# Patient Record
Sex: Female | Born: 1995 | Hispanic: Yes | Marital: Single | Smoking: Never smoker
Health system: Southern US, Community
[De-identification: ages and names within clinical notes are randomized; demographics above are authoritative.]

## PROBLEM LIST (undated history)

## (undated) ENCOUNTER — Encounter

## (undated) ENCOUNTER — Telehealth

## (undated) ENCOUNTER — Encounter: Attending: Internal Medicine | Primary: Internal Medicine

## (undated) ENCOUNTER — Encounter
Attending: Pharmacist Clinician (PhC)/ Clinical Pharmacy Specialist | Primary: Pharmacist Clinician (PhC)/ Clinical Pharmacy Specialist

## (undated) ENCOUNTER — Ambulatory Visit: Payer: Medicaid (Managed Care)

## (undated) ENCOUNTER — Telehealth: Attending: Clinical | Primary: Clinical

## (undated) ENCOUNTER — Encounter: Payer: PRIVATE HEALTH INSURANCE | Attending: Family | Primary: Family

## (undated) ENCOUNTER — Telehealth: Attending: Obstetrics & Gynecology | Primary: Obstetrics & Gynecology

## (undated) ENCOUNTER — Ambulatory Visit: Payer: PRIVATE HEALTH INSURANCE

## (undated) ENCOUNTER — Encounter
Payer: PRIVATE HEALTH INSURANCE | Attending: Student in an Organized Health Care Education/Training Program | Primary: Student in an Organized Health Care Education/Training Program

## (undated) ENCOUNTER — Encounter: Attending: Infectious Disease | Primary: Infectious Disease

## (undated) ENCOUNTER — Telehealth: Attending: Internal Medicine | Primary: Internal Medicine

## (undated) ENCOUNTER — Ambulatory Visit: Payer: Medicaid (Managed Care) | Attending: Obstetrics & Gynecology | Primary: Obstetrics & Gynecology

## (undated) ENCOUNTER — Encounter: Attending: Obstetrics & Gynecology | Primary: Obstetrics & Gynecology

## (undated) ENCOUNTER — Ambulatory Visit

## (undated) ENCOUNTER — Encounter: Payer: PRIVATE HEALTH INSURANCE | Attending: Clinical | Primary: Clinical

## (undated) ENCOUNTER — Ambulatory Visit: Attending: Obstetrics | Primary: Obstetrics

## (undated) DIAGNOSIS — R569 Unspecified convulsions: Secondary | ICD-10-CM

## (undated) DIAGNOSIS — G47419 Narcolepsy without cataplexy: Secondary | ICD-10-CM

---
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
## 2017-04-20 IMAGING — CT CT ANGIO CHEST
2 of 9 series · 19 of 46 positions shown · IV contrast (isovue)
Comparison: Chest radiograph yesterday.

CLINICAL DATA: Chest tightness and shortness of breath.

EXAM:
CT ANGIOGRAPHY CHEST WITH CONTRAST
TECHNIQUE: Multidetector CT imaging of the chest was performed using the
standard protocol during bolus administration of intravenous
contrast. Multiplanar CT image reconstructions and MIPs were
obtained to evaluate the vascular anatomy.
CONTRAST:  100 cc Isovue 370 IV

[Series 5: thins · axial · 0.59mm/px · z∈[+437,+708]mm · 16 of 305 slices shown]
[im 17/305  lung]
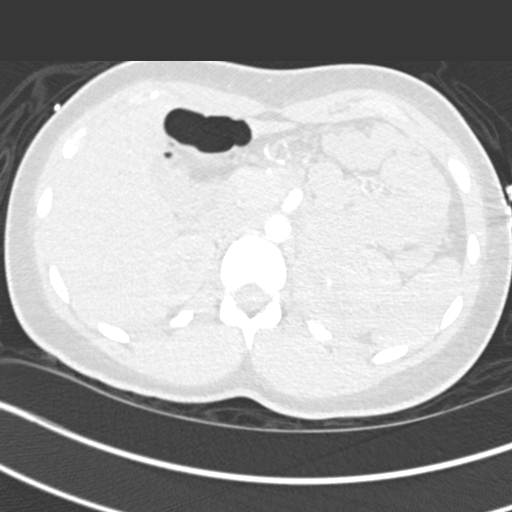
[im 34/305  soft-tissue]
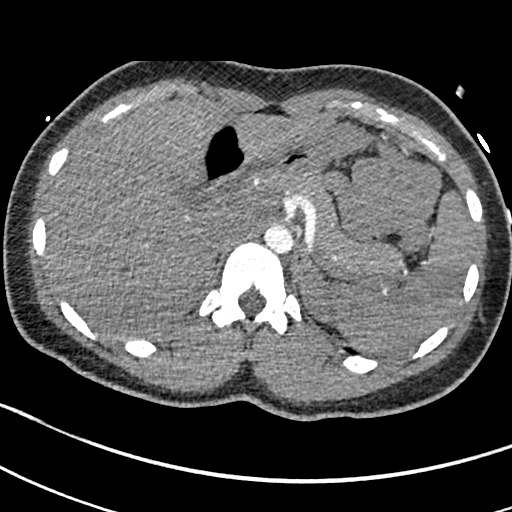
[im 51/305  lung]
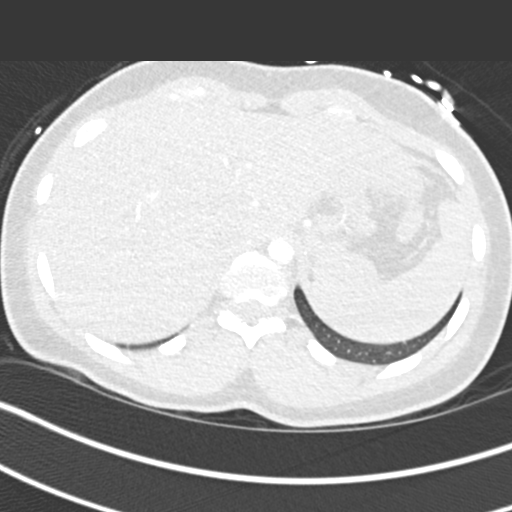
[im 68/305  soft-tissue]
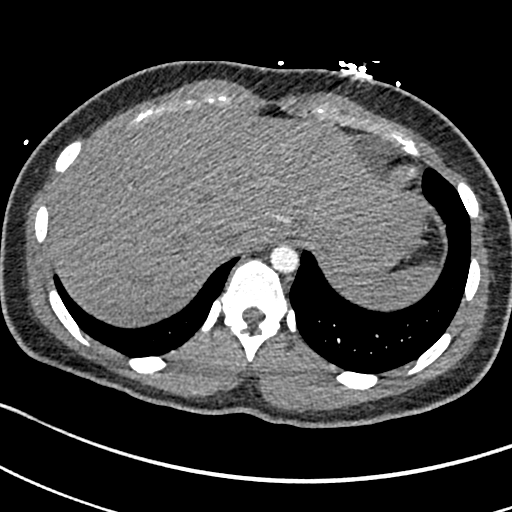
[im 85/305  lung]
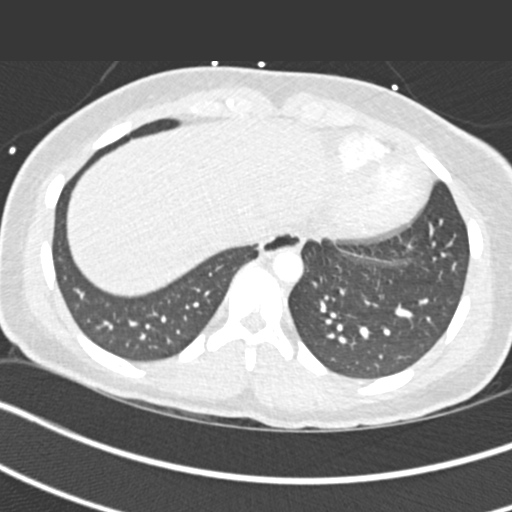
[im 102/305  soft-tissue]
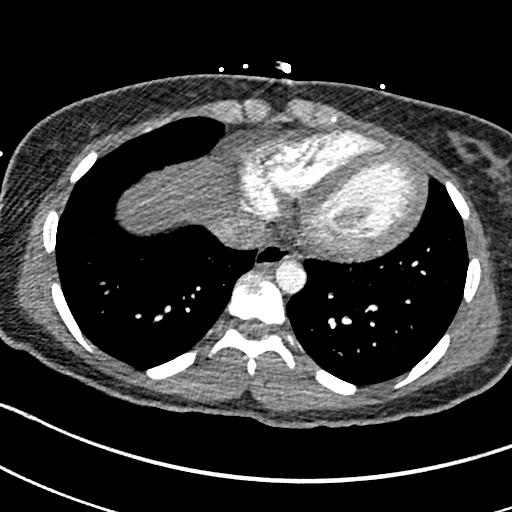
[im 119/305  lung]
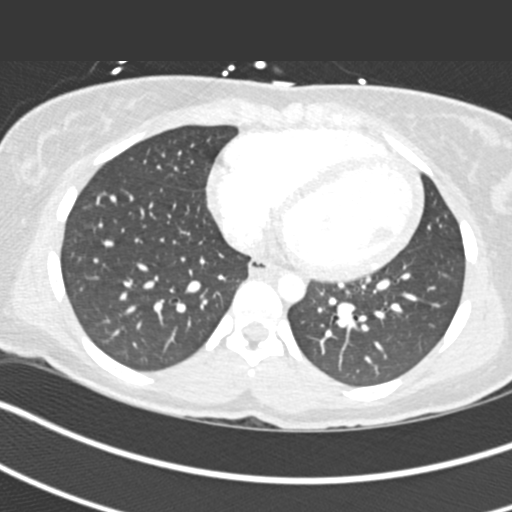
[im 136/305  soft-tissue]
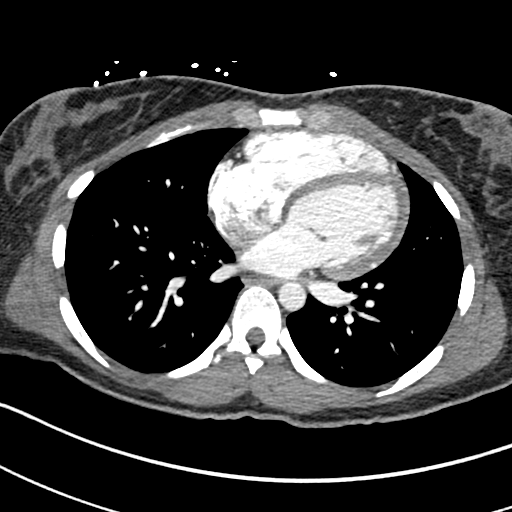
[im 169/305  lung]
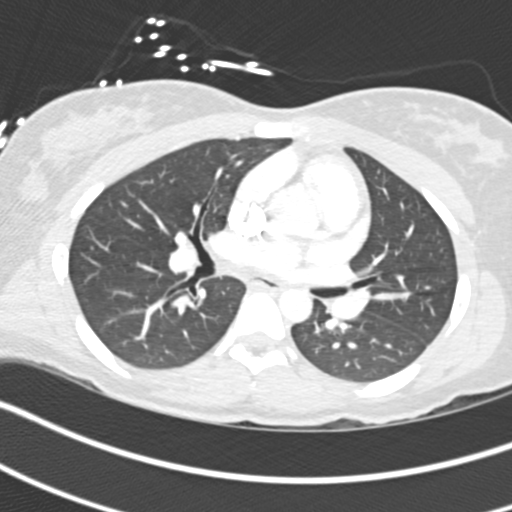
[im 186/305  soft-tissue]
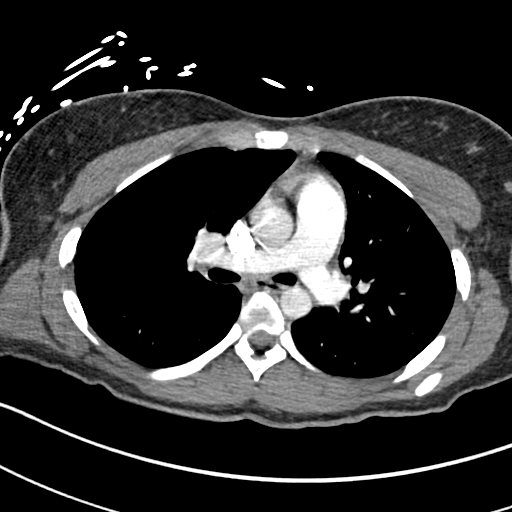
[im 203/305  lung]
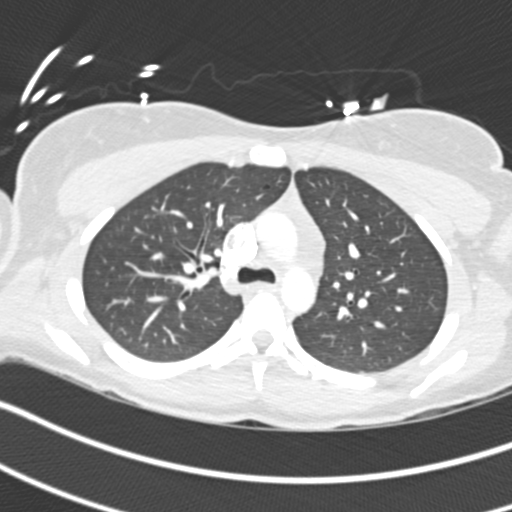
[im 220/305  soft-tissue]
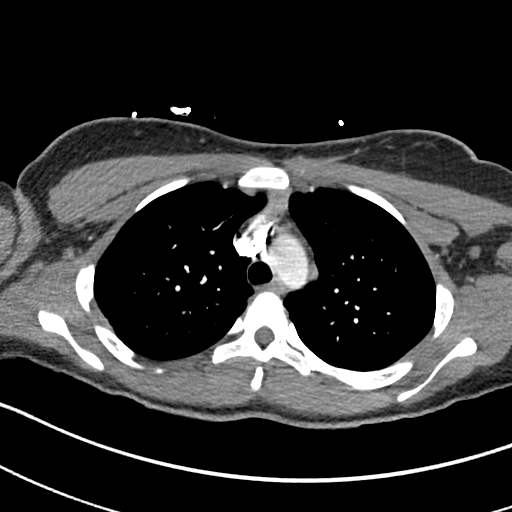
[im 237/305  lung]
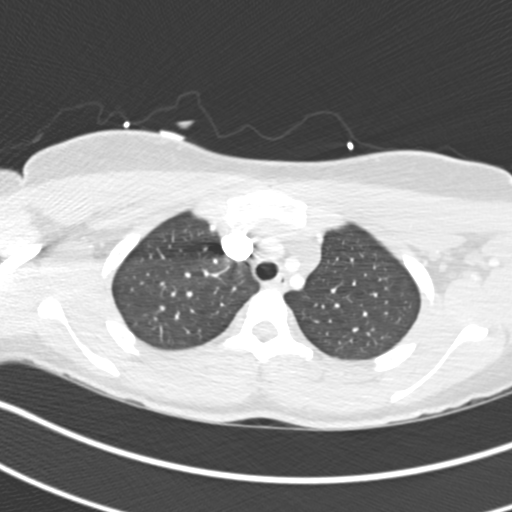
[im 254/305  soft-tissue]
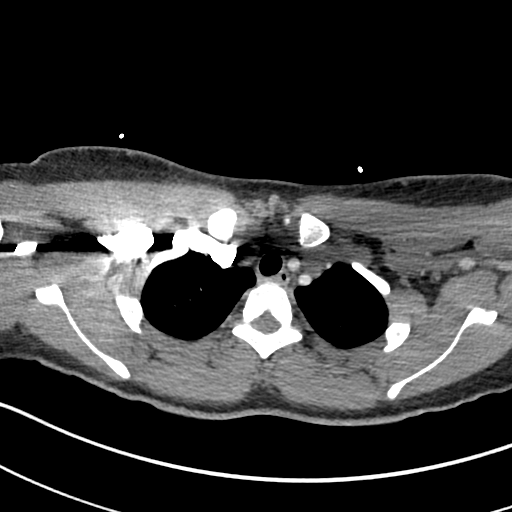
[im 271/305  lung]
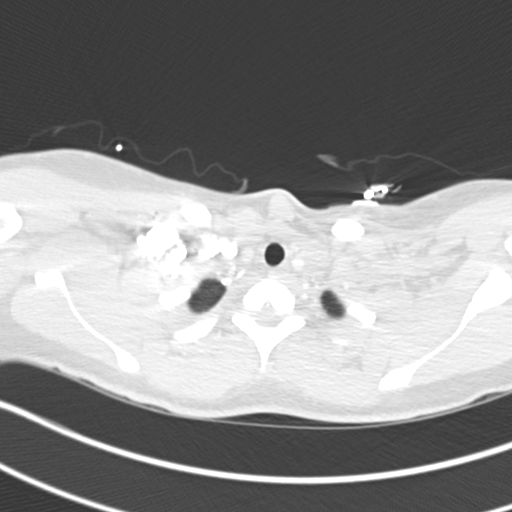
[im 288/305  soft-tissue]
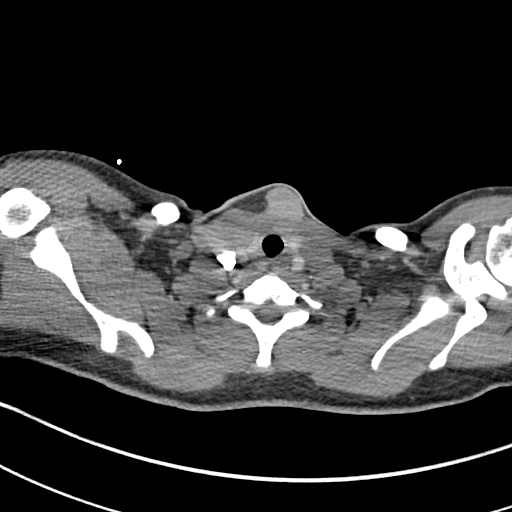

[Series 10: coronal mpr · coronal · 0.59mm/px · 3 of 151 slices shown]
[im 31/151  soft-tissue]
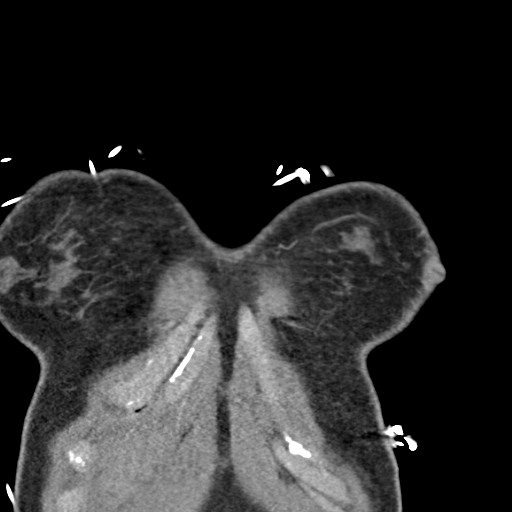
[im 61/151  soft-tissue]
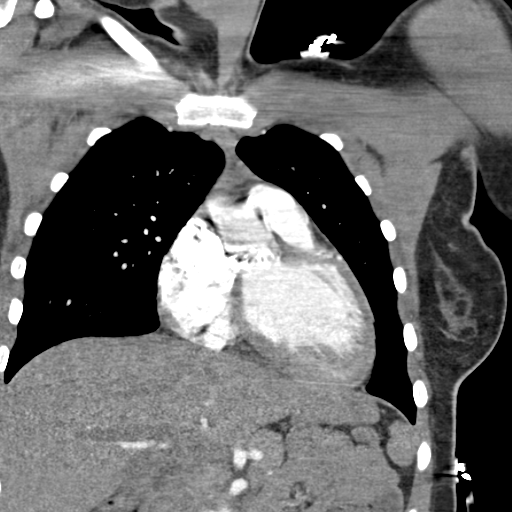
[im 91/151  soft-tissue]
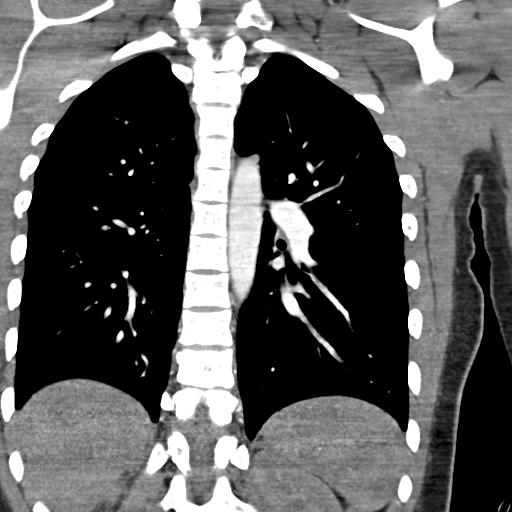

[19 of 46 positions shown; findings below may reference images not displayed]

FINDINGS: Cardiovascular: Satisfactory opacification of the pulmonary arteries
to the segmental level. No evidence of pulmonary embolism. Normal
heart size. No pericardial effusion.

Mediastinum/Nodes: No enlarged mediastinal, hilar, or axillary lymph
nodes. Normal for age minimal residual thymus. Thyroid gland,
trachea, and esophagus demonstrate no significant findings.

Lungs/Pleura: Clear lungs. No consolidation, pulmonary edema or
pleural fluid.

Upper Abdomen: No acute abnormality.

Musculoskeletal: No chest wall abnormality. No acute or significant
osseous findings.

Review of the MIP images confirms the above findings.
IMPRESSION: Normal CTA of the chest.  No specifically, no pulmonary embolus.

## 2018-02-11 ENCOUNTER — Ambulatory Visit (INDEPENDENT_AMBULATORY_CARE_PROVIDER_SITE_OTHER): Payer: Federal, State, Local not specified - PPO

## 2018-02-11 ENCOUNTER — Ambulatory Visit (HOSPITAL_COMMUNITY)
Admission: EM | Admit: 2018-02-11 | Discharge: 2018-02-11 | Disposition: A | Discharge status: Home / Self Care | Payer: Federal, State, Local not specified - PPO | Attending: Internal Medicine | Admitting: Internal Medicine

## 2018-02-11 ENCOUNTER — Other Ambulatory Visit: Payer: Self-pay

## 2018-02-11 ENCOUNTER — Encounter (HOSPITAL_COMMUNITY): Payer: Self-pay | Admitting: Emergency Medicine

## 2018-02-11 DIAGNOSIS — M79672 Pain in left foot: Secondary | ICD-10-CM

## 2018-02-11 DIAGNOSIS — H1032 Unspecified acute conjunctivitis, left eye: Secondary | ICD-10-CM | POA: Diagnosis not present

## 2018-02-11 HISTORY — DX: Narcolepsy without cataplexy: G47.419

## 2018-02-11 MED ORDER — TETRACAINE HCL 0.5 % OP SOLN
OPHTHALMIC | Status: AC
  Filled 2018-02-11: qty 4

## 2018-02-11 MED ORDER — ERYTHROMYCIN 5 MG/GM OP OINT: TOPICAL_OINTMENT | OPHTHALMIC | 0 refills | Status: AC

## 2018-02-11 NOTE — ED Triage Notes (Signed)
Left foot issue for 2 weeks.  Patient has pain in the middle of the sole of foot.  No known injury  Left eye issues that started last night.  Reports having felt like something in left eye.  Left eye swollen. Patient reports dried drainage around eyelashes this morning.  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

## 2018-02-11 NOTE — Discharge Instructions (Addendum)
You may not work with clients until you have used the antibiotic eye medication for 24 hours and there is no more eye discharge. You are contagious via touch, so wash your hands any time you touch your eyes.   Take Ibuprofe up to 800 mg three times a day for 7 days for pain and inflammation of your foot and do some foot stretches.   Follow with foot specialist if you dont need a referral from your family Dr.    Claudie Fisherman back if your eye gets worse this weekend.

## 2018-02-11 NOTE — ED Provider Notes (Signed)
[facility]    CSN: [id number] Arrival date & time: 02/11/18  1122     History   Chief Complaint Chief Complaint  Patient presents with  . Eye Problem  . Foot Pain    HPI Amanda Matthews is a 23 y.o. female.   1- Has L mid sole of foot pain x 2 weeks. Denies an injury.  This started while laying in bed and felt itchy and swollen. Pain is intermittent and is provoked with walking. Has massaged it, and has not tried anything else. Nothing has helped the pain.   2- L eye felt like she had something last night and this am woke up with crustiness. This am felt the FB sensation in her L eye again. 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 She does not wear contacts. Has not had URI.      Past Medical History:  Diagnosis Date  . Narcolepsy   . Seizures (HCC)     There are no active problems to display for this patient.   History reviewed. No pertinent surgical history.  OB History   No obstetric history on file.      Home Medications    Prior to Admission medications   Medication Sig Start Date End Date Taking? Authorizing Provider  Armodafinil 200 MG TABS Take 300 mg by mouth daily.  09/28/15  Yes [provider]  bismuth subsalicylate (PEPTO BISMOL) 262 MG/15ML suspension Take 30 mLs by mouth every 6 (six) hours as needed for indigestion.     [provider]  erythromycin ophthalmic ointment Place a 1/2 inch ribbon of ointment into the L  lower eyelid tid x 7 days 02/11/18   Rodriguez-Southworth, Nettie ElmSylvia, PA-C    Family History Family History  Problem Relation Age of Onset  . Hypertension Father     Social History Social History   Tobacco Use  . Smoking status: Never Smoker  . Smokeless tobacco: Never Used  Substance Use Topics  . Alcohol use: No  . Drug use: No     Allergies   Patient has no known allergies.   Review of Systems Review of Systems  Constitutional: Negative for chills, diaphoresis and fever.    HENT: Negative.   Eyes: Positive for pain, discharge and itching. Negative for photophobia, redness and visual disturbance.       Has mild pain when she closes her eyes.   Respiratory: Negative for cough.   Cardiovascular: Negative for leg swelling.  Musculoskeletal: Positive for arthralgias and gait problem.       L foot pain as noted in HPI  Skin: Negative for color change, rash and wound.  Neurological: Negative for weakness and numbness.  Hematological: Negative for adenopathy.     Physical Exam Triage Vital Signs ED Triage Vitals  Enc Vitals Group     BP 02/11/18 1310 (!) 119/43     Pulse Rate 02/11/18 1310 63     Resp 02/11/18 1310 18     Temp 02/11/18 1310 98 F (36.7 C)     Temp Source 02/11/18 1310 Oral     SpO2 02/11/18 1310 100 %     Weight --      Height --      Head Circumference --      Peak Flow --      Pain Score 02/11/18 1305 7     Pain Loc --      Pain Edu? --      Excl. in  GC? --    No data found.  Updated Vital Signs BP (!) 119/43 (BP Location: Right Arm)   Pulse 63   Temp 98 F (36.7 C) (Oral)   Resp 18   LMP 01/25/2018   SpO2 100%   Visual Acuity Right Eye Distance:   Left Eye Distance:   Bilateral Distance:    Right Eye Near:   Left Eye Near:    Bilateral Near:     Physical Exam Vitals signs and nursing note reviewed.  Constitutional:      General: She is not in acute distress.    Appearance: Normal appearance. She is not toxic-appearing.  HENT:     Right Ear: Tympanic membrane, ear canal and external ear normal.     Left Ear: Tympanic membrane, ear canal and external ear normal.     Nose: Nose normal.  Eyes:     General: No scleral icterus.       Left eye: Discharge present.    Extraocular Movements: Extraocular movements intact.     Pupils: Pupils are equal, round, and reactive to light.     Comments: L conjunctiva is injected. No FB or corneal abrasion noted with fluorecine stain.   Neck:     Musculoskeletal: Neck  supple.  Pulmonary:     Effort: Pulmonary effort is normal.  Musculoskeletal:        General: Tenderness present.     Comments: L mid dorsal foot looks a little swollen and is very tender to palpation, I made her cry.   Skin:    General: Skin is warm and dry.     Findings: No bruising, erythema or rash.  Neurological:     Mental Status: She is alert and oriented to person, place, and time.     Gait: Gait normal.  Psychiatric:        Mood and Affect: Mood normal.        Behavior: Behavior normal.        Thought Content: Thought content normal.        Judgment: Judgment normal.      UC Treatments / Results  Labs (all labs ordered are listed, but only abnormal results are displayed) Labs Reviewed - No data to display  EKG None  Radiology Dg Foot Complete Left  Result Date: 02/11/2018 CLINICAL DATA:  Midfoot pain for 2 weeks, no known injury, initial encounter EXAM: LEFT FOOT - COMPLETE 3+ VIEW COMPARISON:  None. FINDINGS: There is no evidence of fracture or dislocation. There is no evidence of arthropathy or other focal bone abnormality. Soft tissues are unremarkable. IMPRESSION: No acute abnormality noted. Electronically Signed   By: Alcide Clever M.D.   On: 02/11/2018 14:14    Procedures Procedures   Medications Ordered in UC Medications - No data to display  Initial Impression / Assessment and Plan / UC Course  I have reviewed the triage vital signs and the nursing notes.  Pertinent labs & imaging results that were available during my care of the patient were reviewed by me and considered in my medical decision making (see chart for details).    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 You are contagious via touch, so  wash  your hands any time you touch your eyes.   Take Ibuprofe up to 800 mg three times a day for 7 days for pain and inflammation of your foot and do some foot stretches.   Follow with foot specialist if you dont need a referral from your family Dr.    Claudie Fishermanome back if your eye gets worse this weekend.     ED Prescriptions    Medication Sig Dispense Auth. Provider   erythromycin ophthalmic ointment Place a 1/2 inch ribbon of ointment into the L  lower eyelid tid x 7 days 3.5 g Rodriguez-Southworth, Nettie ElmSylvia, PA-C     Controlled Substance Prescriptions [place] Controlled Substance Registry consulted?    Garey HamRodriguez-Southworth, Jenelle Drennon, [place]-C 02/12/18 1842

## 2018-03-31 ENCOUNTER — Ambulatory Visit (HOSPITAL_COMMUNITY)
Admission: EM | Admit: 2018-03-31 | Discharge: 2018-03-31 | Disposition: A | Discharge status: Home / Self Care | Payer: Federal, State, Local not specified - PPO | Attending: Family Medicine | Admitting: Family Medicine

## 2018-03-31 ENCOUNTER — Encounter (HOSPITAL_COMMUNITY): Payer: Self-pay | Admitting: Emergency Medicine

## 2018-03-31 ENCOUNTER — Other Ambulatory Visit: Payer: Self-pay

## 2018-03-31 DIAGNOSIS — R000 Tachycardia, unspecified: Secondary | ICD-10-CM | POA: Diagnosis not present

## 2018-03-31 DIAGNOSIS — J020 Streptococcal pharyngitis: Secondary | ICD-10-CM | POA: Diagnosis not present

## 2018-03-31 LAB — POCT RAPID STREP A: Streptococcus, Group A Screen (Direct): POSITIVE — AB

## 2018-03-31 MED ORDER — ONDANSETRON 4 MG PO TBDP
ORAL_TABLET | ORAL | Status: AC
  Filled 2018-03-31: qty 1

## 2018-03-31 MED ORDER — PENICILLIN G BENZATHINE 1200000 UNIT/2ML IM SUSP
1.2000 10*6.[IU] | Freq: Once | INTRAMUSCULAR | Status: AC
  Administered 2018-03-31: 1.2 10*6.[IU] via INTRAMUSCULAR

## 2018-03-31 MED ORDER — ACETAMINOPHEN 325 MG PO TABS
975.0000 mg | ORAL_TABLET | Freq: Once | ORAL | Status: AC
  Administered 2018-03-31: 975 mg via ORAL

## 2018-03-31 MED ORDER — ONDANSETRON 4 MG PO TBDP
4.0000 mg | ORAL_TABLET | Freq: Once | ORAL | Status: AC
  Administered 2018-03-31: 4 mg via ORAL

## 2018-03-31 MED ORDER — ONDANSETRON 4 MG PO TBDP: 4.0000 mg | ORAL_TABLET | Freq: Three times a day (TID) | ORAL | 0 refills | Status: AC | PRN

## 2018-03-31 MED ORDER — ACETAMINOPHEN 325 MG PO TABS
ORAL_TABLET | ORAL | Status: AC
  Filled 2018-03-31: qty 3

## 2018-03-31 MED ORDER — PENICILLIN G BENZATHINE 1200000 UNIT/2ML IM SUSP
INTRAMUSCULAR | Status: AC
  Filled 2018-03-31: qty 2

## 2018-03-31 NOTE — ED Provider Notes (Signed)
[facility]    CSN: [id number] Arrival date & time: 03/31/18  1829     History   Chief Complaint Chief Complaint  Patient presents with  . Fatigue    HPI Amanda Matthews is a 23 y.o. female.   Amanda Matthews presents with complaints of nausea, vomiting, fatigue, weakness, sore throat which started two days ago. States sore throat worsened due to vomiting. Hx of mouth ulcers and had drank acidic fruit juices while in [place] March 1-6. No ear pain. No cough or congsetion. Drinking some fluids. Has vomited up to 5x a day. Limited solid intake. Decreased appetite. No known ill contacts. Headache. Hx of narcolepsy. Hasn't taken any medications except for her medicine to help her sleep at night. Low grade fevers. No rash.    ROS per HPI, negative if not otherwise mentioned.      Past Medical History:  Diagnosis Date  . Narcolepsy   . Seizures (HCC)     There are no active problems to display for this patient.   History reviewed. No pertinent surgical history.  OB History   No obstetric history on file.      Home Medications    Prior to Admission medications   Medication Sig Start Date End Date Taking? Authorizing Provider  Armodafinil 200 MG TABS Take 300 mg by mouth daily.  09/28/15   [provider]  bismuth subsalicylate (PEPTO BISMOL) 262 MG/15ML suspension Take 30 mLs by mouth every 6 (six) hours as needed for indigestion.     [provider]  erythromycin ophthalmic ointment Place a 1/2 inch ribbon of ointment into the L  lower eyelid tid x 7 days 02/11/18   Rodriguez-Southworth, Nettie Elm, PA-C  ondansetron (ZOFRAN-ODT) 4 MG disintegrating tablet Take 1 tablet (4 mg total) by mouth every 8 (eight) hours as needed for nausea or vomiting. 03/31/18   Georgetta Haber, NP    Family History Family History  Problem Relation Age of Onset  . Hypertension Father     Social History Social History   Tobacco Use  . Smoking status:  Never Smoker  . Smokeless tobacco: Never Used  Substance Use Topics  . Alcohol use: No  . Drug use: No     Allergies   Patient has no known allergies.   Review of Systems Review of Systems   Physical Exam Triage Vital Signs ED Triage Vitals [03/31/18 1919]  Enc Vitals Group     BP 96/62     Pulse Rate (!) 148     Resp (!) 22     Temp (!) 100.9 F (38.3 C)     Temp Source Temporal     SpO2 100 %     Weight 185 lb (83.9 kg)     Height 5\' 2"  (1.575 m)     Head Circumference      Peak Flow      Pain Score 9     Pain Loc      Pain Edu?      Excl. in GC?    No data found.  Updated Vital Signs BP 96/62 (BP Location: Right Arm)   Pulse (!) 148   Temp (!) 100.9 F (38.3 C) (Temporal)   Resp (!) 22   Ht 5\' 2"  (1.575 m)   Wt 185 lb (83.9 kg)   LMP 03/15/2018 (Exact Date)   SpO2 100%   BMI 33.84 kg/m    Physical Exam Constitutional:  General: She is not in acute distress.    Appearance: She is well-developed. She is ill-appearing.  HENT:     Head: Normocephalic and atraumatic.     Right Ear: Tympanic membrane, ear canal and external ear normal.     Left Ear: Tympanic membrane, ear canal and external ear normal.     Nose: Nose normal.     Mouth/Throat:     Pharynx: Uvula midline. Pharyngeal swelling and posterior oropharyngeal erythema present. No uvula swelling.     Tonsils: Tonsillar exudate present. Swelling: 2+ on the right.     Comments: Dark and white exudate to right tonsil- exudate? Vomiting on this provider's arrival to room Eyes:     Conjunctiva/sclera: Conjunctivae normal.     Pupils: Pupils are equal, round, and reactive to light.  Cardiovascular:     Rate and Rhythm: Regular rhythm. Tachycardia present.     Heart sounds: Normal heart sounds.  Pulmonary:     Effort: Pulmonary effort is normal.     Breath sounds: Normal breath sounds.  Skin:    General: Skin is warm and dry.  Neurological:     Mental Status: She is alert and oriented to  person, place, and time.      UC Treatments / Results  Labs (all labs ordered are listed, but only abnormal results are displayed) Labs Reviewed  POCT RAPID STREP A - Abnormal; Notable for the following components:      Result Value   Streptococcus, Group A Screen (Direct) POSITIVE (*)    All other components within normal limits    EKG None  Radiology No results found.  Procedures Procedures (including critical care time)  Medications Ordered in UC Medications  ondansetron (ZOFRAN-ODT) disintegrating tablet 4 mg (4 mg Oral Given 03/31/18 1946)  acetaminophen (TYLENOL) tablet 975 mg (975 mg Oral Given 03/31/18 1955)  penicillin g benzathine (BICILLIN LA) 1200000 UNIT/2ML injection 1.2 Million Units (1.2 Million Units Intramuscular Given 03/31/18 1954)    Initial Impression / Assessment and Plan / UC Course  I have reviewed the triage vital signs and the nursing notes.  Pertinent labs & imaging results that were available during my care of the patient were reviewed by me and considered in my medical decision making (see chart for details).     zofran and bicillin provided in room with mild improvement in nausea. Return precautions provided. If symptoms worsen or do not improve in the next week to return to be seen or to follow up with PCP.  Patient verbalized understanding and agreeable to plan.   Final Clinical Impressions(s) / UC Diagnoses   Final diagnoses:  Strep pharyngitis     Discharge Instructions     Throat lozenges, gargles, chloraseptic spray, warm teas, popsicles etc to help with throat pain.   You should start feeling much better within the next 24-48 hours.  Zofran every 8 hours as needed for nausea or vomiting.   Tylenol and/or ibuprofen as needed for pain or fevers.   If symptoms worsen or do not improve in the next week to return to be seen or to follow up with your PCP.     ED Prescriptions    Medication Sig Dispense Auth. Provider   ondansetron  (ZOFRAN-ODT) 4 MG disintegrating tablet Take 1 tablet (4 mg total) by mouth every 8 (eight) hours as needed for nausea or vomiting. 12 tablet Georgetta HaberBurky, Miriya Cloer B, NP     Controlled Substance Prescriptions [place] Controlled Substance Registry consulted? Not Applicable  Georgetta Haber, NP 03/31/18 2019

## 2018-03-31 NOTE — Discharge Instructions (Signed)
Throat lozenges, gargles, chloraseptic spray, warm teas, popsicles etc to help with throat pain.   You should start feeling much better within the next 24-48 hours.  Zofran every 8 hours as needed for nausea or vomiting.   Tylenol and/or ibuprofen as needed for pain or fevers.   If symptoms worsen or do not improve in the next week to return to be seen or to follow up with your PCP.

## 2018-03-31 NOTE — ED Triage Notes (Signed)
Per pt she started having weakness, fatigue and nausea with a headache and generalized body ache since Wednesday.. throat is now sore due to vomiting. No appetite. Fever at this time.

## 2018-03-31 NOTE — ED Notes (Addendum)
Pt given a full glass of water to drink to encourage fluid intake.  Pt is not ready to take the tylenol yet.  She is resting in the room until the nausea subsides more.

## 2018-03-31 NOTE — ED Notes (Signed)
Pt has taken her Tylenol but is concerned may vomit again.  D/C paperwork has been given but pt is not ready to leave yet.  She is resting on the table.

## 2018-04-02 ENCOUNTER — Encounter
Admit: 2018-04-02 | Discharge: 2018-04-02 | Disposition: A | Discharge status: Home / Self Care | Payer: PRIVATE HEALTH INSURANCE

## 2018-04-02 DIAGNOSIS — N390 Urinary tract infection, site not specified: Principal | ICD-10-CM

## 2018-04-02 DIAGNOSIS — R1110 Vomiting, unspecified: Principal | ICD-10-CM

## 2018-04-02 DIAGNOSIS — J029 Acute pharyngitis, unspecified: Principal | ICD-10-CM

## 2018-04-02 DIAGNOSIS — R1115 Cyclical vomiting syndrome unrelated to migraine: Principal | ICD-10-CM

## 2018-04-09 ENCOUNTER — Encounter
Admit: 2018-04-09 | Discharge: 2018-04-09 | Disposition: A | Discharge status: Home / Self Care | Payer: PRIVATE HEALTH INSURANCE

## 2018-04-09 DIAGNOSIS — G47419 Narcolepsy without cataplexy: Principal | ICD-10-CM

## 2018-04-09 DIAGNOSIS — N390 Urinary tract infection, site not specified: Principal | ICD-10-CM

## 2018-04-09 DIAGNOSIS — Z87440 Personal history of urinary (tract) infections: Principal | ICD-10-CM

## 2018-04-09 DIAGNOSIS — T3695XA Adverse effect of unspecified systemic antibiotic, initial encounter: Principal | ICD-10-CM

## 2018-04-09 DIAGNOSIS — R1110 Vomiting, unspecified: Principal | ICD-10-CM

## 2018-04-09 DIAGNOSIS — R197 Diarrhea, unspecified: Principal | ICD-10-CM

## 2018-04-09 DIAGNOSIS — R112 Nausea with vomiting, unspecified: Principal | ICD-10-CM

## 2019-07-03 ENCOUNTER — Other Ambulatory Visit: Payer: Self-pay

## 2019-07-03 ENCOUNTER — Encounter (HOSPITAL_COMMUNITY): Payer: Self-pay

## 2019-07-03 ENCOUNTER — Ambulatory Visit (HOSPITAL_COMMUNITY)
Admission: EM | Admit: 2019-07-03 | Discharge: 2019-07-03 | Disposition: A | Discharge status: Home / Self Care | Payer: Federal, State, Local not specified - PPO | Attending: Physician Assistant | Admitting: Physician Assistant

## 2019-07-03 DIAGNOSIS — R05 Cough: Secondary | ICD-10-CM | POA: Insufficient documentation

## 2019-07-03 DIAGNOSIS — Z20822 Contact with and (suspected) exposure to covid-19: Secondary | ICD-10-CM | POA: Diagnosis not present

## 2019-07-03 DIAGNOSIS — Z8249 Family history of ischemic heart disease and other diseases of the circulatory system: Secondary | ICD-10-CM | POA: Insufficient documentation

## 2019-07-03 DIAGNOSIS — Z79899 Other long term (current) drug therapy: Secondary | ICD-10-CM | POA: Insufficient documentation

## 2019-07-03 DIAGNOSIS — R059 Cough, unspecified: Secondary | ICD-10-CM

## 2019-07-03 DIAGNOSIS — J0110 Acute frontal sinusitis, unspecified: Secondary | ICD-10-CM | POA: Insufficient documentation

## 2019-07-03 DIAGNOSIS — R0982 Postnasal drip: Secondary | ICD-10-CM

## 2019-07-03 MED ORDER — BENZONATATE 100 MG PO CAPS: 100.0000 mg | ORAL_CAPSULE | Freq: Three times a day (TID) | ORAL | 0 refills | Status: AC

## 2019-07-03 MED ORDER — AMOXICILLIN-POT CLAVULANATE 875-125 MG PO TABS: 1.0000 | ORAL_TABLET | Freq: Two times a day (BID) | ORAL | 0 refills | Status: AC

## 2019-07-03 MED ORDER — SALINE SPRAY 0.65 % NA SOLN: 1.0000 | NASAL | 0 refills | Status: AC | PRN

## 2019-07-03 MED ORDER — FLUTICASONE PROPIONATE 50 MCG/ACT NA SUSP: 1.0000 | Freq: Every day | NASAL | 0 refills | Status: AC

## 2019-07-03 NOTE — ED Provider Notes (Signed)
[facility]    CSN: [id number] Arrival date & time: 07/03/19  1229      History   Chief Complaint Chief Complaint  Patient presents with  . cough, nasal congestion, fatigue    HPI Amanda Matthews is a 24 y.o. female.   Patient reports for 2-week history of dry cough, nasal congestion and runny nose and fatigue.  She reports symptoms have been persistent and slightly worsening over the last 2 weeks.  She reports cough has remained dry.  The cough is bothersome occurring throughout the day and occasionally at night.  Nasal congestion has been persistent and slightly increasing over the last 2 weeks.  She reports mainly clear nasal discharge early however there is been some yellow discharge lately she has tried over-the-counter medicines for these things and these did not help.  She reports constant fatigue despite good sleep.  She has had occasional nausea but no vomiting.  Occasional loose stool but is also having formed stools.  She has not had any shortness of breath or chest pain.  Denies significant sore throat.  Occasional frontal headache and pressure behind her eyes.  She reports she had a Covid test last week at her [facility] the rapid was negative she did not hear back on the send out results.         Past Medical History:  Diagnosis Date  . Narcolepsy   . Seizures ([place])     There are no problems to display for this patient.   History reviewed. No pertinent surgical history.  OB History   No obstetric history on file.      Home Medications    Prior to Admission medications   Medication Sig Start Date End Date Taking? Authorizing Provider  amoxicillin-clavulanate (AUGMENTIN) 875-125 MG tablet Take 1 tablet by mouth 2 (two) times daily for 7 days. 07/03/19 07/10/19  Shoichi Mielke, Marguerita Beards, PA-C  Armodafinil 200 MG TABS Take 300 mg by mouth daily.  09/28/15   [provider]  benzonatate (TESSALON) 100 MG capsule Take 1 capsule (100 mg total) by  mouth every 8 (eight) hours. 07/03/19   Manjit Bufano, Marguerita Beards, PA-C  bismuth subsalicylate (PEPTO BISMOL) 262 MG/15ML suspension Take 30 mLs by mouth every 6 (six) hours as needed for indigestion.     [provider]  erythromycin ophthalmic ointment Place a 1/2 inch ribbon of ointment into the L  lower eyelid tid x 7 days 02/11/18   Rodriguez-Southworth, Sunday Spillers, PA-C  fluticasone (FLONASE) 50 MCG/ACT nasal spray Place 1 spray into both nostrils daily. 07/03/19   Trace Cederberg, Marguerita Beards, PA-C  Methylphenidate HCl ER 72 MG TBCR Take 1 tablet by mouth every morning. 01/09/19   [provider]  ondansetron (ZOFRAN-ODT) 4 MG disintegrating tablet Take 1 tablet (4 mg total) by mouth every 8 (eight) hours as needed for nausea or vomiting. 03/31/18   Zigmund Gottron, NP  sodium chloride (OCEAN) 0.65 % SOLN nasal spray Place 1 spray into both nostrils as needed for congestion. 07/03/19   Aliveah Gallant, Marguerita Beards, PA-C    Family History Family History  Problem Relation Age of Onset  . Hypertension Father     Social History Social History   Tobacco Use  . Smoking status: Never Smoker  . Smokeless tobacco: Never Used  Substance Use Topics  . Alcohol use: No  . Drug use: No     Allergies   Patient has no known allergies.   Review of Systems Review of Systems  Physical Exam Triage Vital Signs ED Triage Vitals [07/03/19 1312]  Enc Vitals Group     BP 119/67     Pulse Rate (!) 59     Resp 16     Temp 99 F (37.2 C)     Temp Source Oral     SpO2 99 %     Weight 160 lb (72.6 kg)     Height 5\' 2"  (1.575 m)     Head Circumference      Peak Flow      Pain Score 7     Pain Loc      Pain Edu?      Excl. in GC?    No data found.  Updated Vital Signs BP 119/67   Pulse (!) 59   Temp 99 F (37.2 C) (Oral)   Resp 16   Ht 5\' 2"  (1.575 m)   Wt 160 lb (72.6 kg)   SpO2 99%   BMI 29.26 kg/m   Visual Acuity Right Eye Distance:   Left Eye Distance:   Bilateral Distance:    Right Eye Near:     Left Eye Near:    Bilateral Near:     Physical Exam Vitals and nursing note reviewed.  Constitutional:      General: She is not in acute distress.    Appearance: She is well-developed. She is ill-appearing.  HENT:     Head: Normocephalic and atraumatic.     Comments: Pressure pressure-like pain on palpation of the frontal and maxillary sinuses.    Right Ear: Tympanic membrane normal.     Left Ear: Tympanic membrane normal.     Nose:     Comments: Turbinates swollen and erythematous bilaterally.  There is no nasal discharge in the right nare    Mouth/Throat:     Mouth: Mucous membranes are moist.     Comments: Mild postnasal drip in the oropharynx Eyes:     Extraocular Movements: Extraocular movements intact.     Conjunctiva/sclera: Conjunctivae normal.     Pupils: Pupils are equal, round, and reactive to light.  Cardiovascular:     Rate and Rhythm: Normal rate and regular rhythm.     Heart sounds: No murmur heard.   Pulmonary:     Effort: Pulmonary effort is normal. No respiratory distress.     Breath sounds: Normal breath sounds. No wheezing, rhonchi or rales.  Abdominal:     Palpations: Abdomen is soft.     Tenderness: There is no abdominal tenderness.  Musculoskeletal:     Cervical back: Neck supple.  Skin:    General: Skin is warm and dry.  Neurological:     Mental Status: She is alert.      UC Treatments / Results  Labs (all labs ordered are listed, but only abnormal results are displayed) Labs Reviewed  SARS CORONAVIRUS 2 (TAT 6-24 HRS)    EKG   Radiology No results found.  Procedures Procedures (including critical care time)  Medications Ordered in UC Medications - No data to display  Initial Impression / Assessment and Plan / UC Course  I have reviewed the triage vital signs and the nursing notes.  Pertinent labs & imaging results that were available during my care of the patient were reviewed by me and considered in my medical decision making  (see chart for details).     #Frontal sinusitis #Cough #Postnasal drip Patient is a 24 year old with what may have started as a complicated upper respiratory infection  is now presenting as a sinusitis with a cough driven by postnasal drip.  There we do not have the results of her Covid send out we will send Covid PCR here.  Given duration symptoms and worsening congestion with sinus pressure pain feel she will benefit from a course of antibiotics.  Placed on Augmentin.  Flonase and nasal saline daily.  Tessalon for cough.  Return and follow-up precautions were discussed.  Patient verbalized understanding. Final Clinical Impressions(s) / UC Diagnoses   Final diagnoses:  Acute frontal sinusitis, recurrence not specified  Cough  Postnasal drip     Discharge Instructions     Take the antibiotic 2 times a day for 7 days Use flonase daily Use nasal saline throughout day, continue to clear sinues  Use tessalon for cough throughout day, you may take the robitussin at night  If not improving in 3-4 days, return.   If your Covid-19 test is positive, you will receive a phone call from [facility] regarding your results. Negative test results are not called. Both positive and negative results area always visible on MyChart. If you do not have a MyChart account, sign up instructions are in your discharge papers.   Persons who are directed to care for themselves at home may discontinue isolation under the following conditions:  . At least 10 days have passed since symptom onset and . At least 24 hours have passed without running a fever (this means without the use of fever-reducing medications) and . Other symptoms have improved.  Persons infected with COVID-19 who never develop symptoms may discontinue isolation and other precautions 10 days after the date of their first positive COVID-19 test.       ED Prescriptions    Medication Sig Dispense Auth. Provider   amoxicillin-clavulanate  (AUGMENTIN) 875-125 MG tablet Take 1 tablet by mouth 2 (two) times daily for 7 days. 14 tablet Anuel Sitter, Veryl Speak, PA-C   fluticasone (FLONASE) 50 MCG/ACT nasal spray Place 1 spray into both nostrils daily. 11.1 mL Mariany Mackintosh, Veryl Speak, PA-C   sodium chloride (OCEAN) 0.65 % SOLN nasal spray Place 1 spray into both nostrils as needed for congestion. 44 mL Tehillah Cipriani, Veryl Speak, PA-C   benzonatate (TESSALON) 100 MG capsule Take 1 capsule (100 mg total) by mouth every 8 (eight) hours. 21 capsule Lilyanah Celestin, Veryl Speak, PA-C     PDMP not reviewed this encounter.   Hermelinda Medicus, PA-C 07/03/19 2324

## 2019-07-03 NOTE — Discharge Instructions (Signed)
Take the antibiotic 2 times a day for 7 days Use flonase daily Use nasal saline throughout day, continue to clear sinues  Use tessalon for cough throughout day, you may take the robitussin at night  If not improving in 3-4 days, return.   If your Covid-19 test is positive, you will receive a phone call from [facility] regarding your results. Negative test results are not called. Both positive and negative results area always visible on MyChart. If you do not have a MyChart account, sign up instructions are in your discharge papers.   Persons who are directed to care for themselves at home may discontinue isolation under the following conditions:   At least 10 days have passed since symptom onset and  At least 24 hours have passed without running a fever (this means without the use of fever-reducing medications) and  Other symptoms have improved.  Persons infected with COVID-19 who never develop symptoms may discontinue isolation and other precautions 10 days after the date of their first positive COVID-19 test.

## 2019-07-03 NOTE — ED Triage Notes (Signed)
Pt c/o non productive cough, nasal congestion and fatiguex2wks. Pt states she was tested for COVID about a wk ago and it was neg. Pt has non labored breathing. Pt denies SOB.

## 2019-07-04 LAB — SARS CORONAVIRUS 2 (TAT 6-24 HRS): SARS Coronavirus 2: NEGATIVE

## 2019-07-31 IMAGING — DX DG FOOT COMPLETE 3+V*L*
3 series · 3 of 3 positions shown · non-contrast
Comparison: None.

CLINICAL DATA: Midfoot pain for 2 weeks, no known injury, initial
encounter

EXAM:
LEFT FOOT - COMPLETE 3+ VIEW

[foot ap]
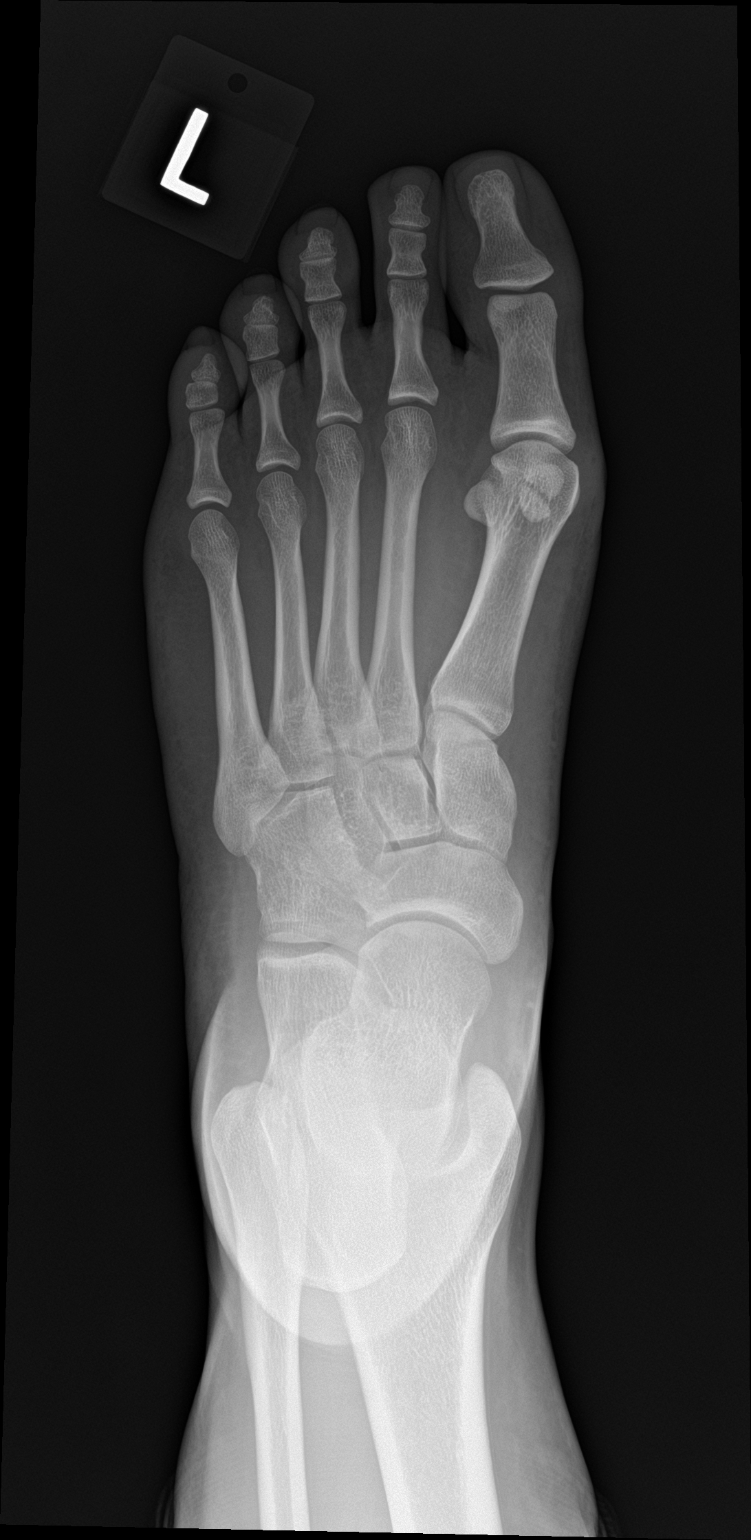

[foot obl]
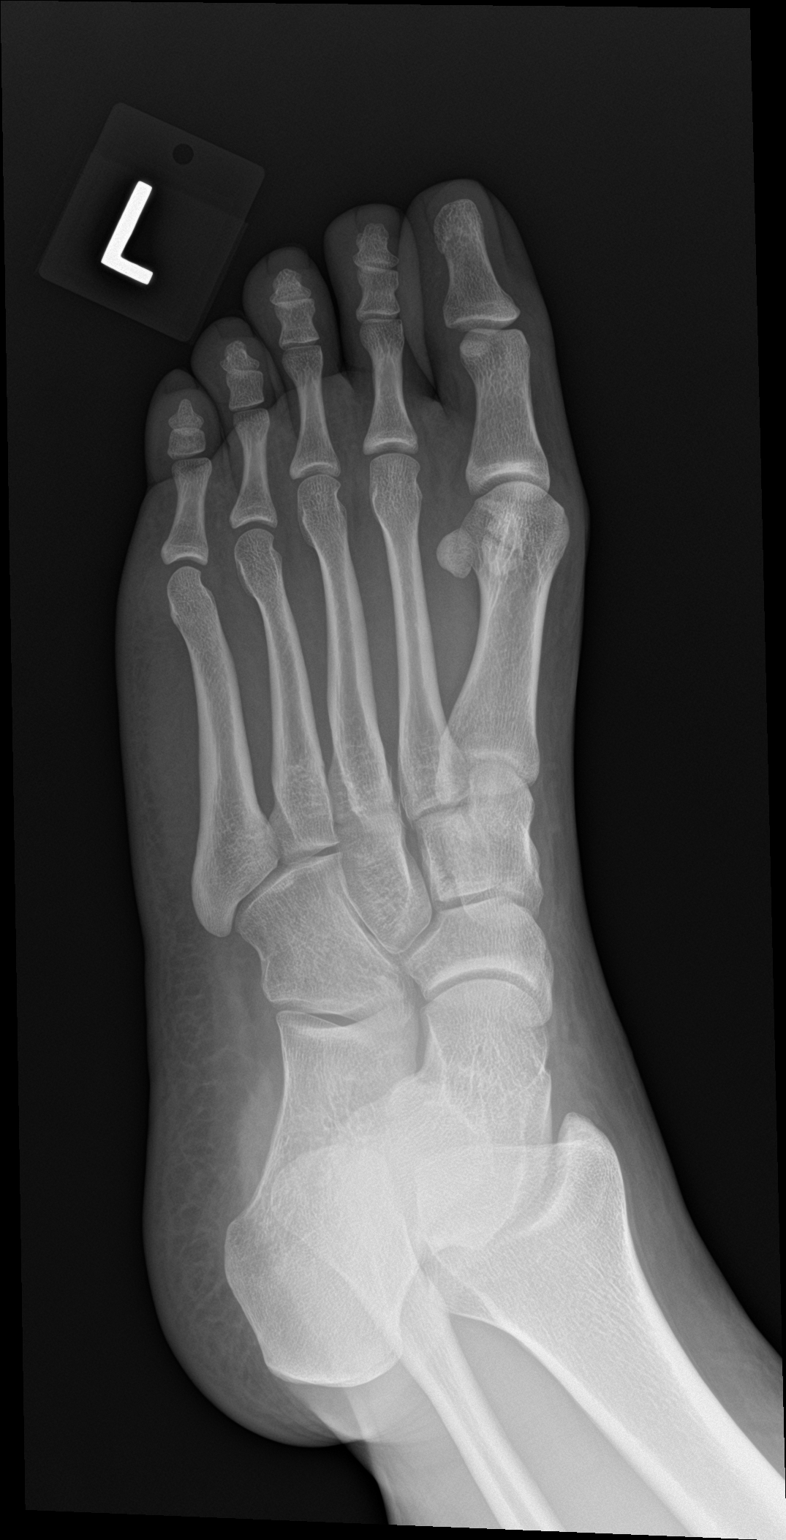

[foot lat]
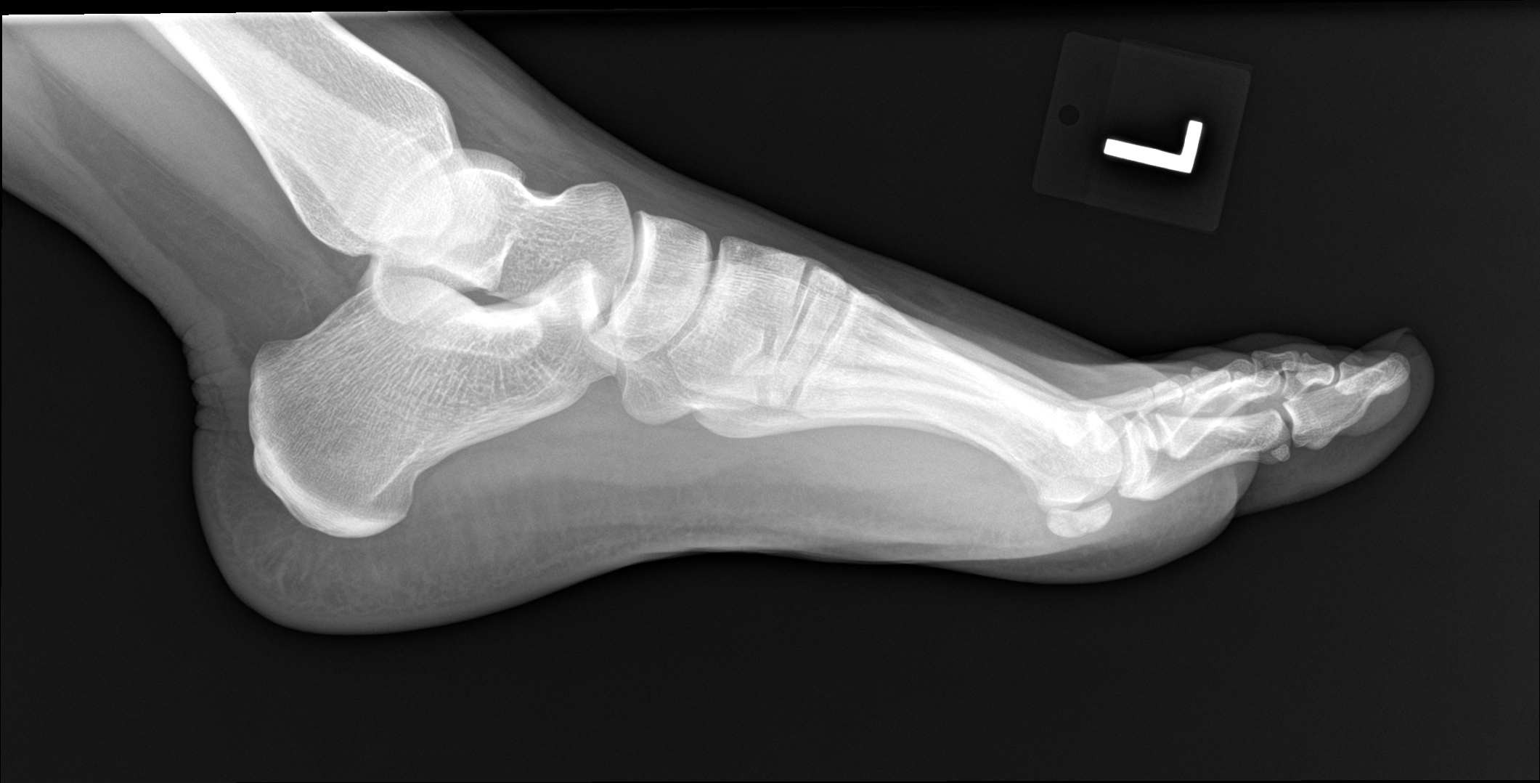

[3 of 3 positions shown; findings below may reference images not displayed]

FINDINGS: There is no evidence of fracture or dislocation. There is no
evidence of arthropathy or other focal bone abnormality. Soft
tissues are unremarkable.
IMPRESSION: No acute abnormality noted.

## 2019-10-03 ENCOUNTER — Emergency Department (HOSPITAL_COMMUNITY)
Admission: EM | Admit: 2019-10-03 | Discharge: 2019-10-03 | Disposition: A | Discharge status: Home / Self Care | Payer: Federal, State, Local not specified - PPO | Attending: Emergency Medicine | Admitting: Emergency Medicine

## 2019-10-03 ENCOUNTER — Other Ambulatory Visit: Payer: Self-pay

## 2019-10-03 DIAGNOSIS — R109 Unspecified abdominal pain: Secondary | ICD-10-CM | POA: Diagnosis present

## 2019-10-03 DIAGNOSIS — U071 COVID-19: Secondary | ICD-10-CM | POA: Insufficient documentation

## 2019-10-03 DIAGNOSIS — Z5321 Procedure and treatment not carried out due to patient leaving prior to being seen by health care provider: Secondary | ICD-10-CM | POA: Diagnosis not present

## 2019-10-03 LAB — SARS CORONAVIRUS 2 BY RT PCR (HOSPITAL ORDER, PERFORMED IN ~~LOC~~ HOSPITAL LAB): SARS Coronavirus 2: POSITIVE — AB

## 2019-10-03 MED ORDER — ONDANSETRON 4 MG PO TBDP
4.0000 mg | ORAL_TABLET | Freq: Once | ORAL | Status: AC
  Administered 2019-10-03: 4 mg via ORAL
  Filled 2019-10-03: qty 1

## 2019-10-03 NOTE — ED Triage Notes (Signed)
Pt reports n/v/d, abdominal pain, diarrhea, body aches, headache x 2 days. Pt also on her period. Has taken negative pregnancy test at home.

## 2020-03-19 DIAGNOSIS — B20 Human immunodeficiency virus [HIV] disease: Principal | ICD-10-CM

## 2020-04-08 ENCOUNTER — Encounter
Admit: 2020-04-08 | Discharge: 2020-04-08 | Payer: PRIVATE HEALTH INSURANCE | Attending: Internal Medicine | Primary: Internal Medicine

## 2020-04-08 ENCOUNTER — Ambulatory Visit: Admit: 2020-04-08 | Discharge: 2020-04-08 | Payer: PRIVATE HEALTH INSURANCE | Attending: Clinical | Primary: Clinical

## 2020-04-08 ENCOUNTER — Ambulatory Visit: Admit: 2020-04-08 | Discharge: 2020-04-08 | Payer: PRIVATE HEALTH INSURANCE

## 2020-04-08 DIAGNOSIS — B20 Human immunodeficiency virus [HIV] disease: Principal | ICD-10-CM

## 2020-04-08 MED ORDER — BICTEGRAVIR 50 MG-EMTRICITABINE 200 MG-TENOFOVIR ALAFENAM 25 MG TABLET
ORAL_TABLET | Freq: Every day | ORAL | 11 refills | 30.00000 days | Status: CP
Start: 2020-04-08 — End: ?
  Filled 2020-04-08: qty 30, 30d supply, fill #0

## 2020-04-09 DIAGNOSIS — Z298 Encounter for other specified prophylactic measures: Principal | ICD-10-CM

## 2020-04-09 MED ORDER — SULFAMETHOXAZOLE 400 MG-TRIMETHOPRIM 80 MG TABLET
ORAL_TABLET | Freq: Every day | ORAL | 0 refills | 30.00000 days | Status: CP
Start: 2020-04-09 — End: 2020-05-09

## 2020-05-13 DIAGNOSIS — B20 Human immunodeficiency virus [HIV] disease: Principal | ICD-10-CM

## 2020-05-13 MED ORDER — BICTEGRAVIR 50 MG-EMTRICITABINE 200 MG-TENOFOVIR ALAFENAM 25 MG TABLET
ORAL_TABLET | Freq: Every day | ORAL | 11 refills | 30.00000 days | Status: CP
Start: 2020-05-13 — End: 2020-05-13

## 2020-05-15 DIAGNOSIS — B373 Candidiasis of vulva and vagina: Principal | ICD-10-CM

## 2020-05-15 MED ORDER — HYLAFEM BORICUM ACIDUM 2X VAGINAL SUPPOSITORY
Freq: Every day | VAGINAL | 1 refills | 3.00000 days | Status: CP
Start: 2020-05-15 — End: 2020-05-18

## 2020-05-16 DIAGNOSIS — Z298 Encounter for other specified prophylactic measures: Principal | ICD-10-CM

## 2020-05-16 MED ORDER — SULFAMETHOXAZOLE 400 MG-TRIMETHOPRIM 80 MG TABLET
ORAL_TABLET | Freq: Every day | ORAL | 5 refills | 30.00000 days | Status: CP
Start: 2020-05-16 — End: 2020-06-15

## 2020-08-14 DIAGNOSIS — Z298 Encounter for other specified prophylactic measures: Principal | ICD-10-CM

## 2020-08-14 MED ORDER — SULFAMETHOXAZOLE 800 MG-TRIMETHOPRIM 160 MG TABLET
ORAL_TABLET | Freq: Every day | ORAL | 5 refills | 30.00000 days | Status: CP
Start: 2020-08-14 — End: ?

## 2020-09-20 ENCOUNTER — Ambulatory Visit: Admit: 2020-09-20 | Discharge: 2020-09-21 | Payer: PRIVATE HEALTH INSURANCE

## 2020-11-16 DIAGNOSIS — B20 Human immunodeficiency virus [HIV] disease: Principal | ICD-10-CM

## 2020-12-30 ENCOUNTER — Ambulatory Visit
Admit: 2020-12-30 | Discharge: 2020-12-31 | Payer: PRIVATE HEALTH INSURANCE | Attending: Internal Medicine | Primary: Internal Medicine

## 2020-12-30 DIAGNOSIS — Z298 Encounter for other specified prophylactic measures: Principal | ICD-10-CM

## 2020-12-30 DIAGNOSIS — B20 Human immunodeficiency virus [HIV] disease: Principal | ICD-10-CM

## 2020-12-30 MED ORDER — BICTEGRAVIR 50 MG-EMTRICITABINE 200 MG-TENOFOVIR ALAFENAM 25 MG TABLET
ORAL_TABLET | Freq: Every day | ORAL | 11 refills | 30 days | Status: CP
Start: 2020-12-30 — End: ?

## 2020-12-30 MED ORDER — SULFAMETHOXAZOLE 800 MG-TRIMETHOPRIM 160 MG TABLET
ORAL_TABLET | Freq: Every day | ORAL | 5 refills | 30.00000 days | Status: CP
Start: 2020-12-30 — End: ?

## 2021-02-10 ENCOUNTER — Ambulatory Visit
Admit: 2021-02-10 | Discharge: 2021-02-11 | Payer: PRIVATE HEALTH INSURANCE | Attending: Internal Medicine | Primary: Internal Medicine

## 2021-02-10 DIAGNOSIS — Z113 Encounter for screening for infections with a predominantly sexual mode of transmission: Principal | ICD-10-CM

## 2021-02-10 DIAGNOSIS — B20 Human immunodeficiency virus [HIV] disease: Principal | ICD-10-CM

## 2021-02-10 MED ORDER — BICTEGRAVIR 50 MG-EMTRICITABINE 200 MG-TENOFOVIR ALAFENAM 25 MG TABLET
ORAL_TABLET | Freq: Every day | ORAL | 11 refills | 30 days | Status: CP
Start: 2021-02-10 — End: ?

## 2021-03-24 DIAGNOSIS — B20 Human immunodeficiency virus [HIV] disease: Principal | ICD-10-CM

## 2021-04-14 DIAGNOSIS — B20 Human immunodeficiency virus [HIV] disease: Principal | ICD-10-CM

## 2021-04-14 MED ORDER — BICTEGRAVIR 50 MG-EMTRICITABINE 200 MG-TENOFOVIR ALAFENAM 25 MG TABLET
ORAL_TABLET | Freq: Every day | ORAL | 9 refills | 30 days | Status: CP
Start: 2021-04-14 — End: ?

## 2021-04-23 ENCOUNTER — Ambulatory Visit: Admit: 2021-04-23 | Discharge: 2021-04-24 | Payer: PRIVATE HEALTH INSURANCE

## 2021-04-23 DIAGNOSIS — B20 Human immunodeficiency virus [HIV] disease: Principal | ICD-10-CM

## 2021-04-23 DIAGNOSIS — N12 Tubulo-interstitial nephritis, not specified as acute or chronic: Principal | ICD-10-CM

## 2021-04-23 DIAGNOSIS — D649 Anemia, unspecified: Principal | ICD-10-CM

## 2021-05-04 DIAGNOSIS — B20 Human immunodeficiency virus [HIV] disease: Principal | ICD-10-CM

## 2021-05-04 MED ORDER — BICTEGRAVIR 50 MG-EMTRICITABINE 200 MG-TENOFOVIR ALAFENAM 25 MG TABLET
ORAL_TABLET | Freq: Every day | ORAL | 9 refills | 30 days | Status: CP
Start: 2021-05-04 — End: ?

## 2021-05-04 NOTE — Unmapped (Signed)
Specialty Medication(s): Biktarvy 50-200-25mg     Hailey Booker has been dis-enrolled from the [facility] specialty pharmacy services due to a pharmacy change. The patient is now filling at    [facility]  [address], [place], [place] [address]  [phone]    Copay Card:    BIN: [id number]  PCN: [id number]  ID: [id number]  Group: [id number]    Hailey Booker does not want medications sent to her home because no one knows her diagnosis.    Additional information provided to the patient: n/a    Roderic Palau  [facility] Specialty Pharmacist

## 2021-05-04 NOTE — Unmapped (Signed)
Resending [employer] as originally written by Dr. Sherrill Raring on 03//29/2023 as patient wants it sent to local pharmacy vs having it mail to her from [facility]    Here is her copay card information: BIN: [id number] PCN: [id number] ID: [id number] Group: [id number]

## 2021-05-04 NOTE — Unmapped (Signed)
[facility] Specialty Medication Onboarding    Specialty Medication: BIKTARVY 50-200-25 mg tablet (bictegrav-emtricit-tenofov ala)  Prior Authorization: Not Required   Financial Assistance: Yes - copay card approved as secondary   Final Copay/Day Supply: $0 / 30    Insurance Restrictions: None     Notes to Pharmacist:     The triage team has completed the benefits investigation and has determined that the patient is able to fill this medication at [facility]. Please contact the patient to complete the onboarding or follow up with the prescribing physician as needed.

## 2021-05-12 DIAGNOSIS — B20 Human immunodeficiency virus [HIV] disease: Principal | ICD-10-CM

## 2021-06-14 DIAGNOSIS — B20 Human immunodeficiency virus [HIV] disease: Principal | ICD-10-CM

## 2021-07-22 DIAGNOSIS — B20 Human immunodeficiency virus [HIV] disease: Principal | ICD-10-CM

## 2021-09-09 DIAGNOSIS — B20 Human immunodeficiency virus [HIV] disease: Principal | ICD-10-CM

## 2021-09-09 MED ORDER — BICTEGRAVIR 50 MG-EMTRICITABINE 200 MG-TENOFOVIR ALAFENAM 25 MG TABLET
ORAL_TABLET | Freq: Every day | ORAL | 2 refills | 30 days | Status: CP
Start: 2021-09-09 — End: ?

## 2021-10-11 DIAGNOSIS — B20 Human immunodeficiency virus [HIV] disease: Principal | ICD-10-CM

## 2021-10-22 ENCOUNTER — Ambulatory Visit: Admit: 2021-10-22 | Discharge: 2021-10-23 | Payer: PRIVATE HEALTH INSURANCE

## 2021-10-22 DIAGNOSIS — B20 Human immunodeficiency virus [HIV] disease: Principal | ICD-10-CM

## 2021-10-22 DIAGNOSIS — Z23 Encounter for immunization: Principal | ICD-10-CM

## 2021-10-22 MED ORDER — BICTEGRAVIR 50 MG-EMTRICITABINE 200 MG-TENOFOVIR ALAFENAM 25 MG TABLET
ORAL_TABLET | Freq: Every day | ORAL | 5 refills | 30.00000 days | Status: CP
Start: 2021-10-22 — End: 2021-10-22

## 2021-11-16 DIAGNOSIS — B20 Human immunodeficiency virus [HIV] disease: Principal | ICD-10-CM

## 2021-11-16 MED ORDER — BICTEGRAVIR 50 MG-EMTRICITABINE 200 MG-TENOFOVIR ALAFENAM 25 MG TABLET
ORAL_TABLET | Freq: Every day | ORAL | 5 refills | 30 days
Start: 2021-11-16 — End: ?

## 2022-01-27 DIAGNOSIS — B20 Human immunodeficiency virus [HIV] disease: Principal | ICD-10-CM

## 2022-01-27 MED ORDER — BICTEGRAVIR 50 MG-EMTRICITABINE 200 MG-TENOFOVIR ALAFENAM 25 MG TABLET
ORAL_TABLET | Freq: Every day | ORAL | 1 refills | 30 days | Status: CP
Start: 2022-01-27 — End: ?

## 2022-04-07 DIAGNOSIS — B20 Human immunodeficiency virus [HIV] disease: Principal | ICD-10-CM

## 2022-04-08 MED ORDER — BICTEGRAVIR 50 MG-EMTRICITABINE 200 MG-TENOFOVIR ALAFENAM 25 MG TABLET
ORAL_TABLET | Freq: Every day | ORAL | 1 refills | 30 days | Status: CP
Start: 2022-04-08 — End: ?

## 2022-06-11 ENCOUNTER — Ambulatory Visit
Admit: 2022-06-11 | Discharge: 2022-06-12 | Disposition: A | Discharge status: Home / Self Care | Payer: PRIVATE HEALTH INSURANCE

## 2022-06-11 ENCOUNTER — Emergency Department
Admit: 2022-06-11 | Discharge: 2022-06-12 | Disposition: A | Discharge status: Home / Self Care | Payer: PRIVATE HEALTH INSURANCE

## 2022-06-11 DIAGNOSIS — R112 Nausea with vomiting, unspecified: Principal | ICD-10-CM

## 2022-06-11 DIAGNOSIS — E860 Dehydration: Principal | ICD-10-CM

## 2022-06-20 DIAGNOSIS — B20 Human immunodeficiency virus [HIV] disease: Principal | ICD-10-CM

## 2022-06-20 MED ORDER — BIKTARVY 50 MG-200 MG-25 MG TABLET
ORAL_TABLET | Freq: Every day | ORAL | 1 refills | 0 days
Start: 2022-06-20 — End: ?

## 2022-06-21 MED ORDER — BIKTARVY 50 MG-200 MG-25 MG TABLET
ORAL_TABLET | Freq: Every day | ORAL | 2 refills | 30 days | Status: CP
Start: 2022-06-21 — End: ?

## 2022-08-16 DIAGNOSIS — B20 Human immunodeficiency virus [HIV] disease: Principal | ICD-10-CM

## 2022-08-16 MED ORDER — BIKTARVY 50 MG-200 MG-25 MG TABLET
ORAL_TABLET | Freq: Every day | ORAL | 2 refills | 30 days
Start: 2022-08-16 — End: ?

## 2022-09-23 DIAGNOSIS — B20 Human immunodeficiency virus [HIV] disease: Principal | ICD-10-CM

## 2022-09-23 MED ORDER — BIKTARVY 50 MG-200 MG-25 MG TABLET
ORAL_TABLET | Freq: Every day | ORAL | 0 refills | 30 days | Status: CP
Start: 2022-09-23 — End: ?

## 2022-10-07 ENCOUNTER — Ambulatory Visit: Admit: 2022-10-07 | Discharge: 2022-10-07 | Payer: PRIVATE HEALTH INSURANCE

## 2022-10-07 DIAGNOSIS — Z87440 Personal history of urinary (tract) infections: Principal | ICD-10-CM

## 2022-10-07 DIAGNOSIS — Z1321 Encounter for screening for nutritional disorder: Principal | ICD-10-CM

## 2022-10-07 DIAGNOSIS — K921 Melena: Principal | ICD-10-CM

## 2022-10-07 DIAGNOSIS — Z113 Encounter for screening for infections with a predominantly sexual mode of transmission: Principal | ICD-10-CM

## 2022-10-07 DIAGNOSIS — B20 Human immunodeficiency virus [HIV] disease: Principal | ICD-10-CM

## 2022-10-07 DIAGNOSIS — Z23 Encounter for immunization: Principal | ICD-10-CM

## 2022-10-07 MED ORDER — NITROFURANTOIN MACROCRYSTAL 50 MG CAPSULE
ORAL_CAPSULE | Freq: Every evening | ORAL | 2 refills | 30 days | Status: CP
Start: 2022-10-07 — End: ?

## 2022-10-07 MED ORDER — BIKTARVY 50 MG-200 MG-25 MG TABLET
ORAL_TABLET | Freq: Every day | ORAL | 5 refills | 30 days | Status: CP
Start: 2022-10-07 — End: ?

## 2022-10-30 DIAGNOSIS — B20 Human immunodeficiency virus [HIV] disease: Principal | ICD-10-CM

## 2022-10-30 MED ORDER — BIKTARVY 50 MG-200 MG-25 MG TABLET
ORAL_TABLET | Freq: Every day | ORAL | 5 refills | 30 days
Start: 2022-10-30 — End: ?

## 2022-11-01 MED ORDER — BIKTARVY 50 MG-200 MG-25 MG TABLET
ORAL_TABLET | Freq: Every day | ORAL | 5 refills | 30 days
Start: 2022-11-01 — End: ?

## 2022-12-01 DIAGNOSIS — B20 Human immunodeficiency virus [HIV] disease: Principal | ICD-10-CM

## 2022-12-01 MED ORDER — BIKTARVY 50 MG-200 MG-25 MG TABLET
ORAL_TABLET | Freq: Every day | ORAL | 1 refills | 30 days | Status: CP
Start: 2022-12-01 — End: ?

## 2023-01-01 DIAGNOSIS — B20 Human immunodeficiency virus [HIV] disease: Principal | ICD-10-CM

## 2023-01-01 MED ORDER — BIKTARVY 50 MG-200 MG-25 MG TABLET
ORAL_TABLET | Freq: Every day | ORAL | 1 refills | 30.00 days
Start: 2023-01-01 — End: ?

## 2023-01-04 MED ORDER — BIKTARVY 50 MG-200 MG-25 MG TABLET
ORAL_TABLET | Freq: Every day | ORAL | 0 refills | 30.00 days | Status: CP
Start: 2023-01-04 — End: ?

## 2023-01-06 ENCOUNTER — Ambulatory Visit: Admit: 2023-01-06 | Discharge: 2023-01-07 | Payer: BLUE CROSS/BLUE SHIELD

## 2023-01-06 DIAGNOSIS — Z862 Personal history of diseases of the blood and blood-forming organs and certain disorders involving the immune mechanism: Principal | ICD-10-CM

## 2023-01-06 DIAGNOSIS — B20 Human immunodeficiency virus [HIV] disease: Principal | ICD-10-CM

## 2023-01-06 DIAGNOSIS — Z87440 Personal history of urinary (tract) infections: Principal | ICD-10-CM

## 2023-01-06 MED ORDER — NITROFURANTOIN MACROCRYSTAL 50 MG CAPSULE
ORAL_CAPSULE | Freq: Every evening | ORAL | 2 refills | 30.00 days | Status: CP
Start: 2023-01-06 — End: ?

## 2023-01-06 MED ORDER — BIKTARVY 50 MG-200 MG-25 MG TABLET
ORAL_TABLET | Freq: Every day | ORAL | 5 refills | 30.00 days | Status: CP
Start: 2023-01-06 — End: ?

## 2023-03-03 DIAGNOSIS — B20 Human immunodeficiency virus [HIV] disease: Principal | ICD-10-CM

## 2023-03-03 MED ORDER — BIKTARVY 50 MG-200 MG-25 MG TABLET
ORAL_TABLET | Freq: Every day | ORAL | 5 refills | 30.00 days | Status: CP
Start: 2023-03-03 — End: ?

## 2023-04-07 DIAGNOSIS — B20 Human immunodeficiency virus [HIV] disease: Principal | ICD-10-CM

## 2023-04-07 MED ORDER — BIKTARVY 50 MG-200 MG-25 MG TABLET
ORAL_TABLET | Freq: Every day | ORAL | 2 refills | 30.00 days | Status: CP
Start: 2023-04-07 — End: ?

## 2023-05-13 DIAGNOSIS — Z01419 Encounter for gynecological examination (general) (routine) without abnormal findings: Principal | ICD-10-CM

## 2023-05-13 DIAGNOSIS — Z309 Encounter for contraceptive management, unspecified: Principal | ICD-10-CM

## 2023-05-13 DIAGNOSIS — B20 Human immunodeficiency virus [HIV] disease: Principal | ICD-10-CM

## 2023-05-13 MED ORDER — BIKTARVY 50 MG-200 MG-25 MG TABLET
ORAL_TABLET | Freq: Every day | ORAL | 2 refills | 30.00000 days
Start: 2023-05-13 — End: ?

## 2023-05-16 MED ORDER — BIKTARVY 50 MG-200 MG-25 MG TABLET
ORAL_TABLET | Freq: Every day | ORAL | 1 refills | 30.00000 days | Status: CP
Start: 2023-05-16 — End: ?

## 2023-06-10 DIAGNOSIS — B20 Human immunodeficiency virus [HIV] disease: Principal | ICD-10-CM

## 2023-06-10 MED ORDER — BIKTARVY 50 MG-200 MG-25 MG TABLET
ORAL_TABLET | Freq: Every day | ORAL | 1 refills | 30.00000 days | Status: CP
Start: 2023-06-10 — End: 2023-06-10

## 2023-07-01 ENCOUNTER — Encounter
Admit: 2023-07-01 | Discharge: 2023-07-02 | Payer: Medicaid (Managed Care) | Attending: Obstetrics & Gynecology | Primary: Obstetrics & Gynecology

## 2023-07-01 ENCOUNTER — Ambulatory Visit: Admit: 2023-07-01 | Discharge: 2023-07-02 | Payer: Medicaid (Managed Care)

## 2023-07-01 DIAGNOSIS — B20 Human immunodeficiency virus [HIV] disease: Principal | ICD-10-CM

## 2023-07-01 DIAGNOSIS — Z124 Encounter for screening for malignant neoplasm of cervix: Principal | ICD-10-CM

## 2023-07-01 DIAGNOSIS — Z113 Encounter for screening for infections with a predominantly sexual mode of transmission: Principal | ICD-10-CM

## 2023-07-01 DIAGNOSIS — Z975 Presence of (intrauterine) contraceptive device: Principal | ICD-10-CM

## 2023-08-08 DIAGNOSIS — B20 Human immunodeficiency virus [HIV] disease: Principal | ICD-10-CM

## 2023-08-08 MED ORDER — BIKTARVY 50 MG-200 MG-25 MG TABLET
ORAL_TABLET | Freq: Every day | ORAL | 1 refills | 30.00000 days
Start: 2023-08-08 — End: ?

## 2023-08-09 MED ORDER — BIKTARVY 50 MG-200 MG-25 MG TABLET
ORAL_TABLET | Freq: Every day | ORAL | 1 refills | 30.00000 days | Status: CP
Start: 2023-08-09 — End: ?

## 2023-09-12 DIAGNOSIS — B20 Human immunodeficiency virus [HIV] disease: Principal | ICD-10-CM

## 2023-09-12 MED ORDER — BIKTARVY 50 MG-200 MG-25 MG TABLET
ORAL_TABLET | Freq: Every day | ORAL | 1 refills | 30.00000 days | Status: CP
Start: 2023-09-12 — End: ?

## 2023-10-09 DIAGNOSIS — B20 Human immunodeficiency virus [HIV] disease: Principal | ICD-10-CM

## 2023-10-09 MED ORDER — BIKTARVY 50 MG-200 MG-25 MG TABLET
ORAL_TABLET | Freq: Every day | ORAL | 1 refills | 30.00000 days
Start: 2023-10-09 — End: ?

## 2023-10-10 MED ORDER — BIKTARVY 50 MG-200 MG-25 MG TABLET
ORAL_TABLET | Freq: Every day | ORAL | 0 refills | 30.00000 days | Status: CP
Start: 2023-10-10 — End: ?

## 2023-12-01 DIAGNOSIS — E669 Obesity, unspecified: Principal | ICD-10-CM

## 2023-12-12 DIAGNOSIS — B20 Human immunodeficiency virus [HIV] disease: Principal | ICD-10-CM

## 2023-12-12 MED ORDER — BIKTARVY 50 MG-200 MG-25 MG TABLET
ORAL_TABLET | Freq: Every day | ORAL | 0 refills | 30.00000 days | Status: CP
Start: 2023-12-12 — End: ?

## 2024-01-09 DIAGNOSIS — B20 Human immunodeficiency virus [HIV] disease: Principal | ICD-10-CM

## 2024-01-09 MED ORDER — BIKTARVY 50 MG-200 MG-25 MG TABLET
ORAL_TABLET | Freq: Every day | ORAL | 0 refills | 30.00000 days
Start: 2024-01-09 — End: ?

## 2024-01-10 MED ORDER — BIKTARVY 50 MG-200 MG-25 MG TABLET
ORAL_TABLET | Freq: Every day | ORAL | 1 refills | 30.00000 days | Status: CP
Start: 2024-01-10 — End: ?

## 2024-01-31 ENCOUNTER — Ambulatory Visit
Admit: 2024-01-31 | Discharge: 2024-02-01 | Payer: Medicaid (Managed Care) | Attending: Internal Medicine | Primary: Internal Medicine

## 2024-01-31 DIAGNOSIS — G47419 Narcolepsy without cataplexy: Principal | ICD-10-CM

## 2024-01-31 DIAGNOSIS — L906 Striae atrophicae: Principal | ICD-10-CM

## 2024-01-31 DIAGNOSIS — N12 Tubulo-interstitial nephritis, not specified as acute or chronic: Principal | ICD-10-CM

## 2024-01-31 DIAGNOSIS — R110 Nausea: Principal | ICD-10-CM

## 2024-01-31 DIAGNOSIS — B20 Human immunodeficiency virus [HIV] disease: Principal | ICD-10-CM

## 2024-01-31 DIAGNOSIS — N3000 Acute cystitis without hematuria: Principal | ICD-10-CM

## 2024-01-31 DIAGNOSIS — R1110 Vomiting, unspecified: Principal | ICD-10-CM

## 2024-01-31 DIAGNOSIS — R5383 Other fatigue: Principal | ICD-10-CM

## 2024-01-31 DIAGNOSIS — E66813 Class III morbid obesity with body mass index (BMI) of 40.0 to 49.9 (CMS-HCC): Principal | ICD-10-CM

## 2024-01-31 MED ORDER — SEMAGLUTIDE (WEIGHT LOSS) 1 MG/0.5 ML SUBCUTANEOUS PEN INJECTOR
SUBCUTANEOUS | 2 refills | 28.00000 days | Status: CP
Start: 2024-01-31 — End: ?

## 2024-01-31 MED ORDER — ONDANSETRON HCL 4 MG TABLET
ORAL_TABLET | Freq: Three times a day (TID) | ORAL | 2 refills | 7.00000 days | Status: CP | PRN
Start: 2024-01-31 — End: ?

## 2024-01-31 MED ORDER — DEXAMETHASONE 1 MG TABLET
ORAL_TABLET | INTRAMUSCULAR | 1 refills | 0.00000 days | Status: CP
Start: 2024-01-31 — End: ?
  Filled 2024-02-14: qty 1, 1d supply, fill #0

## 2024-01-31 MED ORDER — SEMAGLUTIDE (WEIGHT LOSS) 0.25 MG/0.5 ML SUBCUTANEOUS PEN INJECTOR
SUBCUTANEOUS | 0 refills | 0.00000 days | Status: CP
Start: 2024-01-31 — End: ?

## 2024-01-31 MED ORDER — SEMAGLUTIDE (WEIGHT LOSS) 0.5 MG/0.5 ML SUBCUTANEOUS PEN INJECTOR
SUBCUTANEOUS | 0 refills | 0.00000 days | Status: CP
Start: 2024-01-31 — End: ?

## 2024-02-01 ENCOUNTER — Ambulatory Visit: Admit: 2024-02-01 | Discharge: 2024-02-02 | Payer: Medicaid (Managed Care)

## 2024-02-01 DIAGNOSIS — Z30430 Encounter for insertion of intrauterine contraceptive device: Principal | ICD-10-CM

## 2024-02-01 DIAGNOSIS — N879 Dysplasia of cervix uteri, unspecified: Principal | ICD-10-CM

## 2024-02-01 DIAGNOSIS — E66813 Class III morbid obesity with body mass index (BMI) of 40.0 to 49.9 (CMS-HCC): Principal | ICD-10-CM

## 2024-02-17 DIAGNOSIS — B20 Human immunodeficiency virus [HIV] disease: Principal | ICD-10-CM

## 2024-02-17 MED ORDER — BIKTARVY 50 MG-200 MG-25 MG TABLET
ORAL_TABLET | Freq: Every day | ORAL | 0 refills | 30.00000 days | Status: CP
Start: 2024-02-17 — End: ?
